# Patient Record
Sex: Female | Born: 1990 | Race: Asian | Hispanic: No | Marital: Married | Smoking: Never smoker
Health system: Southern US, Community
[De-identification: ages and names within clinical notes are randomized; demographics above are authoritative.]

## PROBLEM LIST (undated history)

## (undated) DIAGNOSIS — Z789 Other specified health status: Secondary | ICD-10-CM

## (undated) HISTORY — PX: NO PAST SURGERIES: SHX2092

---

## 2015-11-08 ENCOUNTER — Other Ambulatory Visit: Payer: Self-pay | Admitting: Obstetrics and Gynecology

## 2015-11-15 ENCOUNTER — Other Ambulatory Visit (HOSPITAL_COMMUNITY): Payer: Self-pay | Admitting: Obstetrics and Gynecology

## 2015-11-15 DIAGNOSIS — Z3689 Encounter for other specified antenatal screening: Secondary | ICD-10-CM

## 2015-11-15 DIAGNOSIS — O289 Unspecified abnormal findings on antenatal screening of mother: Secondary | ICD-10-CM

## 2015-11-17 ENCOUNTER — Encounter (HOSPITAL_COMMUNITY): Payer: Self-pay

## 2015-11-17 ENCOUNTER — Other Ambulatory Visit (HOSPITAL_COMMUNITY): Payer: Self-pay | Admitting: Obstetrics and Gynecology

## 2015-11-17 ENCOUNTER — Ambulatory Visit (HOSPITAL_COMMUNITY)
Admission: RE | Admit: 2015-11-17 | Discharge: 2015-11-17 | Disposition: A | Discharge status: Home / Self Care | Payer: Medicaid Other | Source: Ambulatory Visit | Attending: Obstetrics and Gynecology | Admitting: Obstetrics and Gynecology

## 2015-11-17 DIAGNOSIS — O289 Unspecified abnormal findings on antenatal screening of mother: Secondary | ICD-10-CM | POA: Insufficient documentation

## 2015-11-17 DIAGNOSIS — Z3689 Encounter for other specified antenatal screening: Secondary | ICD-10-CM

## 2015-11-17 DIAGNOSIS — Z3A21 21 weeks gestation of pregnancy: Secondary | ICD-10-CM | POA: Insufficient documentation

## 2015-11-17 HISTORY — DX: Other specified health status: Z78.9

## 2015-11-17 NOTE — Progress Notes (Signed)
Genetic Counseling  DOB: [birth year] Referring Provider: Ferman Hamming, MD Appointment Date: 11/17/2015 Attending: Dr. Particia Nearing  [name] and her husband, partner, Mr. Brianna Young, were seen for genetic counseling because of an increased risk for fetal Down syndrome based on a maternal serum Quad screen.  Brianna Young/Brianna Young interpretation was provided with an in person interpreter.  In summary:  Discussed increased risk for Down syndrome based on Quad screen results  Reviewed normal ultrasound  Discussed options for additional testing or screening  Declined amniocentesis  Declined NIPS  Consider screening for hemoglobinopathies if this has not already been performed  They were counseled regarding the Quad screen result and the associated 1 in 20 risk for fetal Down syndrome.  This was the new risk estimate based upon the adjusted due date of 03/23/16.   We reviewed chromosomes, nondisjunction, and the common features and variable prognosis of Down syndrome.  In addition, we reviewed the screen adjusted reduction in risks for trisomy 18 and open neural tube defects.  We discussed other explanations for a screen positive result including: differences in maternal metabolism, placental irregularities, and normal variation. They understand that this screening is not diagnostic for Down syndrome but provides a risk assessment.  A complete ultrasound was performed today. The ultrasound report will be sent under separate cover. There were no visualized fetal anomalies or markers suggestive of aneuploidy.  They were counseled that 50-80% of fetuses with Down syndrome will have detectable anomalies or soft markers of aneuploidy on second trimester ultrasound.  We reviewed the available screening option of noninvasive prenatal screening (NIPS)/cell free DNA (cfDNA) screening. We reviewed the benefits and limitations of this option. Specifically, we discussed the conditions for which the test screens,  the detection rates, and false positive rates . They were also counseled regarding diagnostic testing via amniocentesis. We reviewed the approximate 1 in 300-500 risk for complications for amniocentesis, including spontaneous pregnancy loss.   Diagnostic testing and additional screening were declined today.  They understand that screening tests, including ultrasound and Quad screen cannot rule out all birth defects or genetic syndromes. The patient was advised of this limitation and states she does not want additional testing or screening at this time.   Both family histories were reviewed and found to be noncontributory.  Persons of [place] descent are at increased risk for certain hemoglobinopathies.  Specifically, they are at an increased risk for beta thalassemia and Hemoglobin E (Hb E).  Both of these are autosomal recessive conditions and involve abnormalities in the beta globin gene on chromosome 11.  Hb E is especially significant when combined with beta thalassemia trait (Hb E/ beta-thal).  This condition is highly variable, but may result in clinical symptoms similar to beta thalassemia major.  Hb EE (Hb E inherited from each parent) is generally benign.  Persons from [place] are at increased risk for alpha thalassemia, also an autosomal recessive condition.  Alpha thalassemia is different in its inheritance as there are two alpha globin genes on each chromosome 16 (aa/aa).  A person can be a carrier of one alpha gene mutation (aa/a-) or of more than one mutation.  A person who carriers two alpha globin gene mutations can either carry them in cis (both on the same chromosome aa/--) or in trans (on different chromosomes a-/a-) .  [place] alpha thalassemia carriers usually have a cis arrangement (aa/--) of their alpha globin gene mutations.  Thus, carriers are at risk for having a child with  the most severe form of alpha thalassemia, hydrops fetalis with Hb Barts, which is  associated with an absence of alpha globin chain synthesis as a result of deletions of all four alpha globin genes (--/--).  There is also a non-deleted alpha chain hemoglobin variant, Hemoglobin Constant Spring (Hb CS) that is relatively common among [place] Asians.  Hb CS can lead to Hemoglobin H disease when inherited along with alpha thalassemia trait (aa/--).  Hemoglobinopathy carrier rates in the [place] Asian populations are quite variable and difficult to quantitate specifically.  Thus, screening for hemoglobinopathies is recommended for this population group by CBC and quantitative hemoglobin electrophoresis.  As iron deficiency is also associated with a low MCV, this should be ruled out by simultaneous serum ferritin studies.  If these have not been performed in your office, you may wish to consider this option.  As this patient is from [place], her chance to be a carrier for beta thalassemia is 1-3%, for alpha thalassemia is unknown and for Hb E is 5-30%.   Without further information regarding the provided family history, an accurate genetic risk cannot be calculated. Further genetic counseling is warranted if more information is obtained.  Ms. Levings denied exposure to environmental toxins or chemical agents. She denied the use of alcohol, tobacco or street drugs. She denied significant viral illnesses during the course of her pregnancy. Her medical and surgical histories were noncontributory.   I counseled this couple for approximately 65 minutes regarding the above risks and available options. Most of the counseling was provided by Cornelia Copa, [employer] genetic counseling student, under my direct supervision.  Brianna Gemma, MS,  Certified Genetic Counselor

## 2016-09-21 ENCOUNTER — Encounter (HOSPITAL_COMMUNITY): Payer: Self-pay

## 2017-08-07 IMAGING — US US MFM OB DETAIL+14 WK
1 series · 14 of 28 positions shown · non-contrast
Comparison: none

[Series 1: us mfm ob detail+14 wk · 76 acquisitions, 14 frames shown]
[im 3/76]
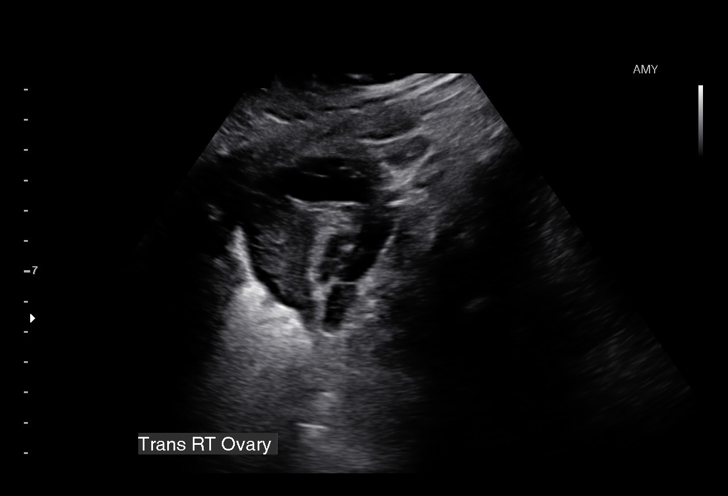
[im 9/76]
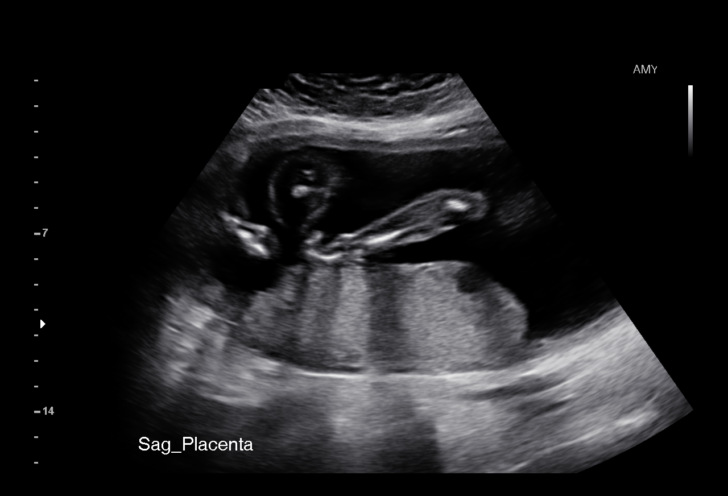
[im 14/76]
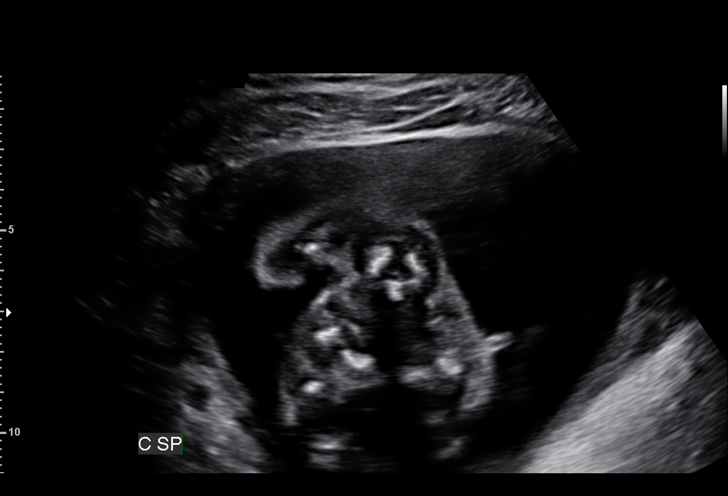
[im 20/76]
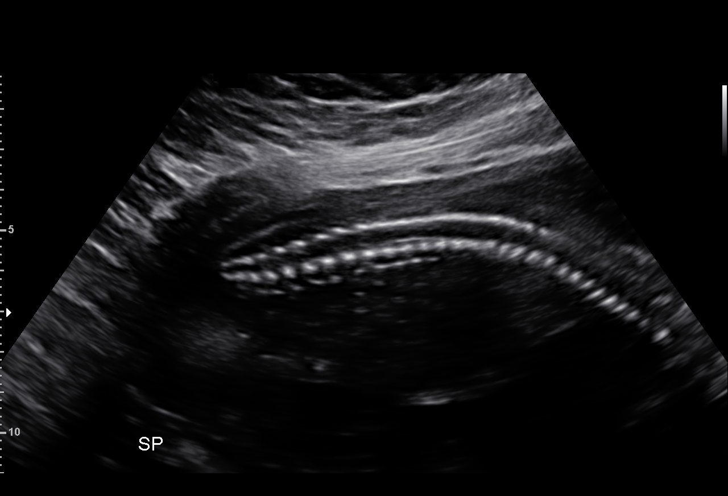
[im 26/76]
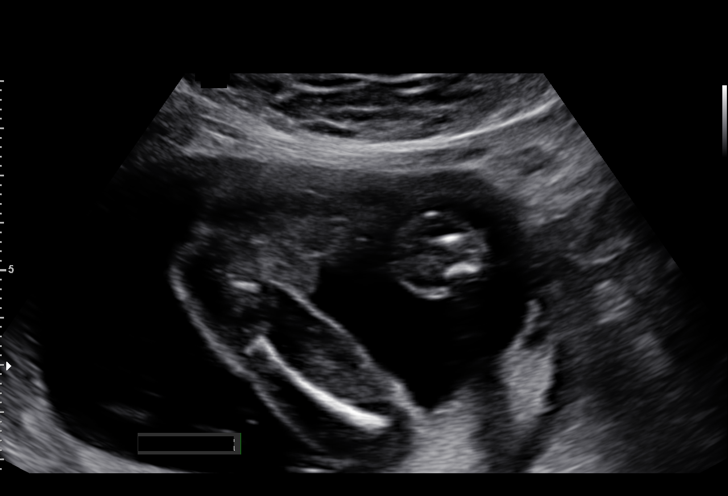
[im 31/76]
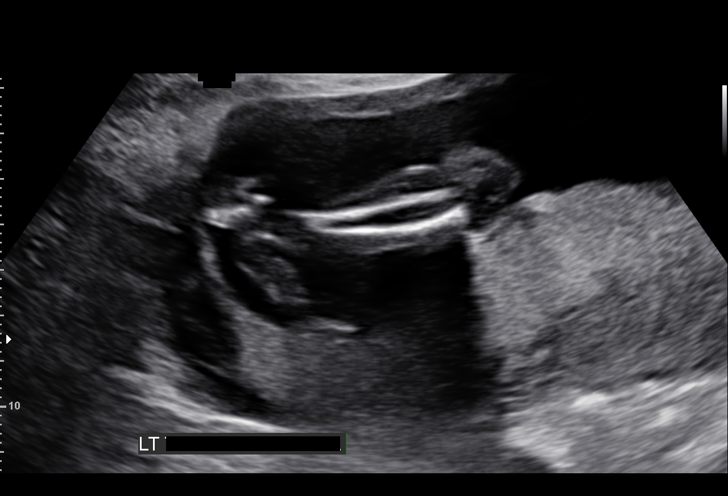
[im 37/76]
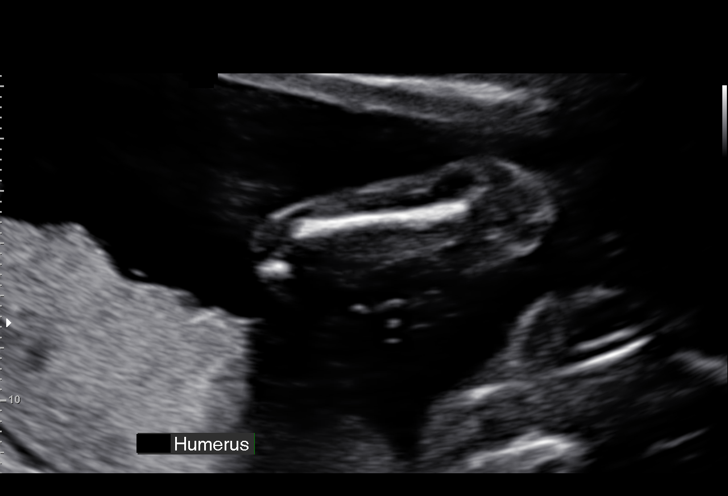
[im 42/76]
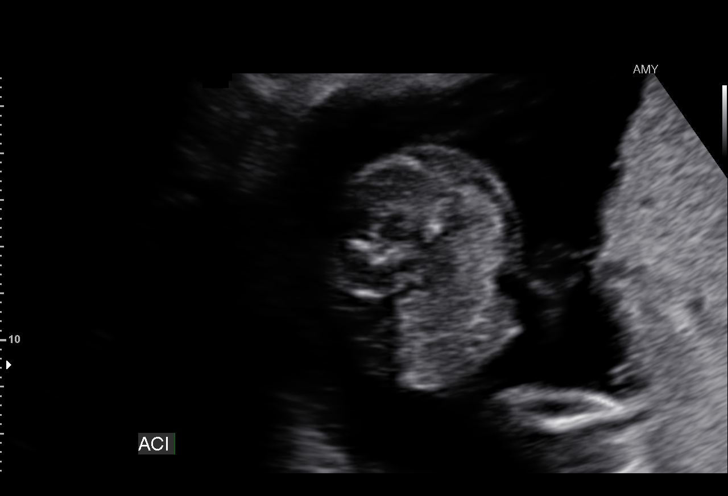
[im 48/76]
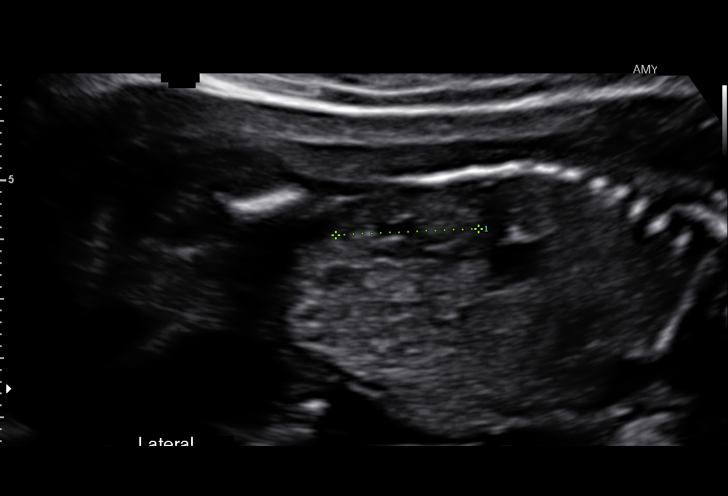
[im 53/76]
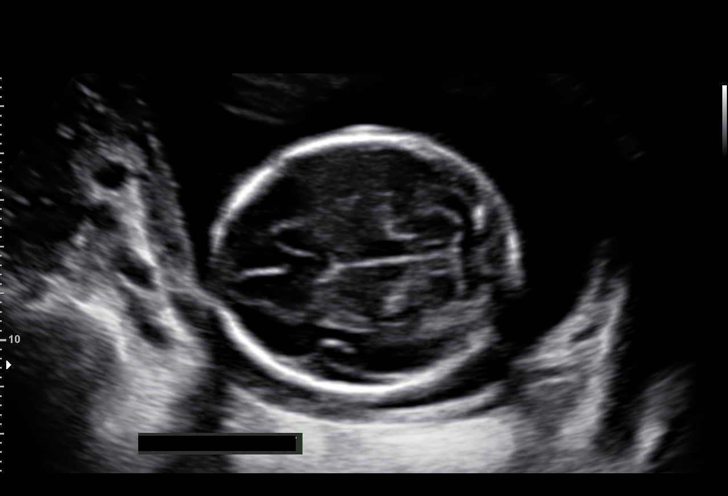
[im 59/76]
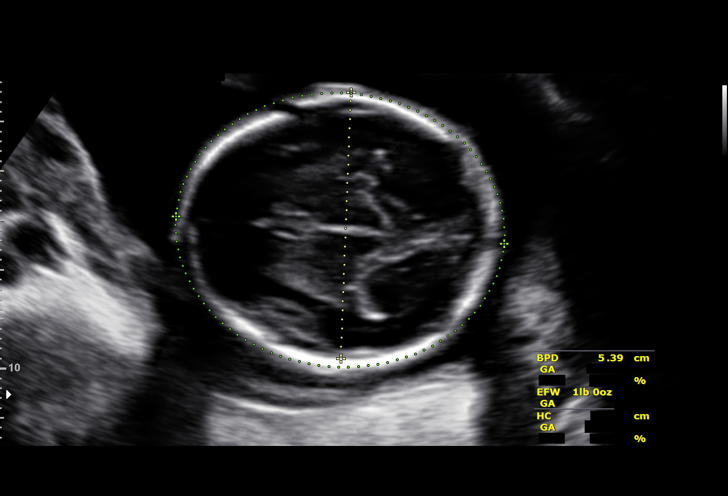
[im 64/76]
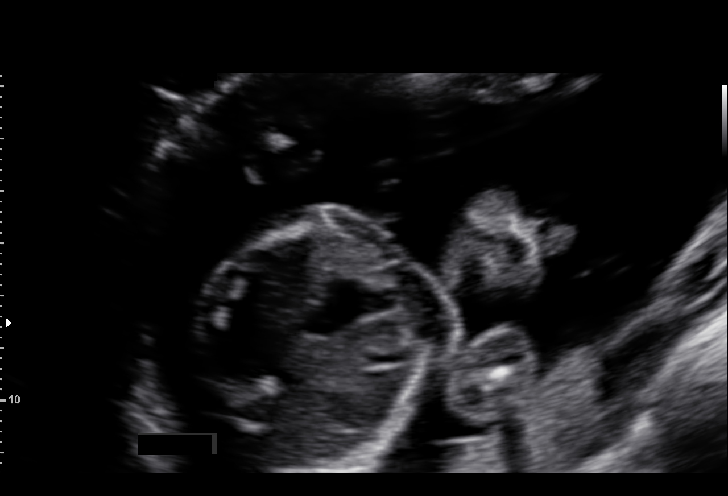
[im 70/76]
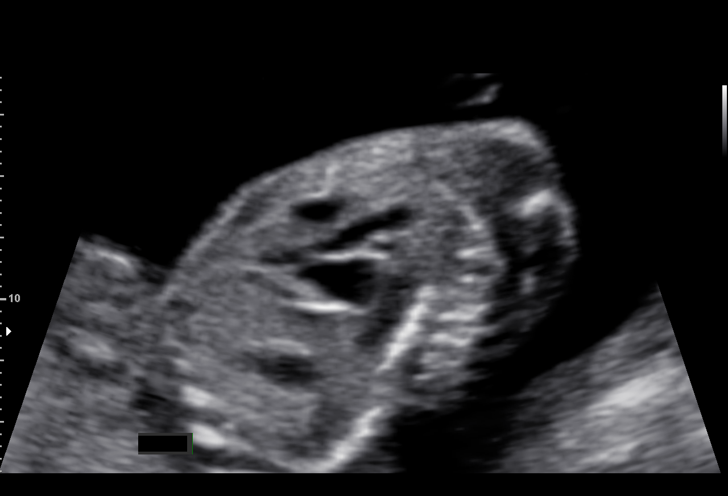
[im 76/76]
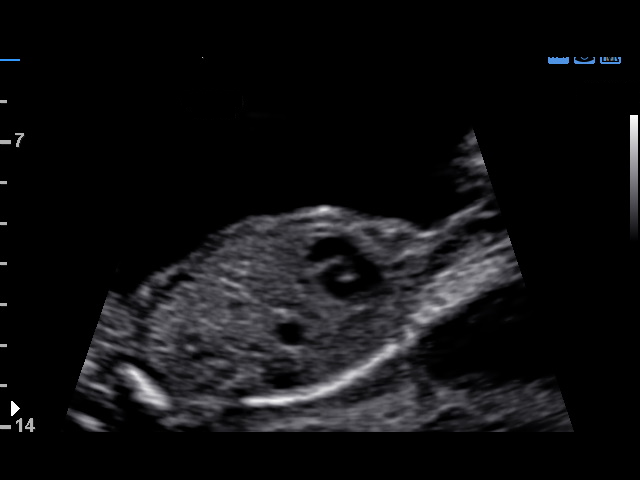

[14 of 28 positions shown; findings below may reference images not displayed]

[facility] [REDACTED]
[HOSPITAL][HOSPITAL]
[REDACTED]
[address]HOSPITAL][HOSPITAL]

1  MUAAD                  [id number]      [id number]     [id number]
ZEN
Indications

Detailed fetal anatomic survey                 Z36
Abnormal biochemical screen; increased
DSR of 1 in 20 by quad screen
21 weeks gestation of pregnancy
OB History

Gravidity:    1         Term:   0        Prem:   0         SAB:   0
TOP:          0       Ectopic:  0        Living: 0
Fetal Evaluation

Num Of Fetuses:     1
Fetal Heart         148
Rate(bpm):
Cardiac Activity:   Observed
Presentation:       Cephalic
Placenta:           Posterior, above cervical os
P. Cord Insertion:  Visualized

Amniotic Fluid
AFI FV:      Subjectively within normal limits
Larg Pckt:    5.9  cm
Biometry

BPD:      53.9  mm     G. Age:  22w 3d                  CI:         78.37  %    70 - 86
FL/HC:       18.9  %    18.4 -
HC:      192.6  mm     G. Age:  21w 4d         25  %    HC/AC:       1.13       1.06 -
AC:      170.2  mm     G. Age:  22w 0d         47  %    FL/BPD:      67.5  %    71 - 87
FL:       36.4  mm     G. Age:  21w 4d         30  %    FL/AC:       21.4  %    20 - 24
HUM:      34.6  mm     G. Age:  21w 6d         49  %
CER:        24  mm     G. Age:  22w 0d         54  %
LV:        8.3  mm
CM:        3.4  mm

Est. FW:     452   gm          1 lb     44  %
Gestational Age

LMP:           20w 2d        Date:  06/28/15                 EDD:    04/03/16
U/S Today:     21w 6d                                        EDD:    03/23/16
Best:          21w 6d     Det. By:  U/S (11/17/15)           EDD:    03/23/16
Anatomy

Cranium:          Appears normal         Aortic Arch:      Appears normal
Fetal Cavum:      Appears normal         Ductal Arch:      Appears normal
Ventricles:       Appears normal         Diaphragm:        Appears normal
Choroid Plexus:   Appears normal         Stomach:          Appears normal, left
sided
Cerebellum:       Appears normal         Abdomen:          Appears normal
Posterior Fossa:  Appears normal         Abdominal Wall:   Appears nml (cord
insert, abd wall)
Nuchal Fold:      Not applicable (>20    Cord Vessels:     Appears normal (3
wks GA)                                  vessel cord)
Face:             Appears normal         Kidneys:          Appear normal
(orbits and profile)
Lips:             Appears normal         Bladder:          Appears normal
Fetal Thoracic:   Appears normal         Spine:            Appears normal
Heart:            Appears normal         Upper             Appears normal
(4CH, axis, and        Extremities:
situs)
RVOT:             Appears normal         Lower             Appears normal
Extremities:
LVOT:             Appears normal

Other:  Heels appear normal. Fetus appears to be a male.
Cervix Uterus Adnexa

Cervix
Length:           3.27  cm.
Normal appearance by transabdominal scan. Appears closed, without
funnelling.

Left Ovary
Within normal limits.

Right Ovary
Within normal limits.
Impression

SIUP at 21+6 weeks
Normal detailed fetal anatomy
Markers of aneuploidy: none
Normal amniotic fluid volume
EDC based on today's measurements (also c/w 20 week US
in office)

After genetic counseling, Ms. Sing declined further aneuploidy
testing.
Recommendations

Follow-up as clinically indicated
Please see genetic counseling note
